# Patient Record
Sex: Male | Born: 1985 | Race: Black or African American | Hispanic: No | Marital: Single | Smoking: Never smoker
Health system: Southern US, Community
[De-identification: ages and names within clinical notes are randomized; demographics above are authoritative.]

## PROBLEM LIST (undated history)

## (undated) DIAGNOSIS — J939 Pneumothorax, unspecified: Secondary | ICD-10-CM

## (undated) DIAGNOSIS — Z789 Other specified health status: Secondary | ICD-10-CM

## (undated) HISTORY — PX: NO PAST SURGERIES: SHX2092

---
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
## 2006-04-30 ENCOUNTER — Emergency Department (HOSPITAL_COMMUNITY): Admission: EM | Admit: 2006-04-30 | Discharge: 2006-04-30 | Payer: Self-pay | Admitting: Emergency Medicine

## 2006-11-03 ENCOUNTER — Emergency Department (HOSPITAL_COMMUNITY): Admission: EM | Admit: 2006-11-03 | Discharge: 2006-11-03 | Payer: Self-pay | Admitting: Emergency Medicine

## 2007-09-12 ENCOUNTER — Emergency Department (HOSPITAL_COMMUNITY): Admission: EM | Admit: 2007-09-12 | Discharge: 2007-09-12 | Payer: Self-pay | Admitting: Emergency Medicine

## 2008-10-21 ENCOUNTER — Emergency Department (HOSPITAL_COMMUNITY): Admission: EM | Admit: 2008-10-21 | Discharge: 2008-10-21 | Payer: Self-pay | Admitting: Family Medicine

## 2010-03-12 ENCOUNTER — Emergency Department (HOSPITAL_COMMUNITY)
Admission: EM | Admit: 2010-03-12 | Discharge: 2010-03-12 | Payer: Self-pay | Source: Home / Self Care | Admitting: Family Medicine

## 2010-06-10 LAB — GLUCOSE, CAPILLARY: Glucose-Capillary: 87 mg/dL (ref 70–99)

## 2013-08-31 ENCOUNTER — Encounter (HOSPITAL_COMMUNITY): Payer: Self-pay | Admitting: Emergency Medicine

## 2013-08-31 ENCOUNTER — Emergency Department (HOSPITAL_COMMUNITY)
Admission: EM | Admit: 2013-08-31 | Discharge: 2013-08-31 | Disposition: A | Discharge status: Home / Self Care | Payer: Self-pay | Attending: Emergency Medicine | Admitting: Emergency Medicine

## 2013-08-31 DIAGNOSIS — K1370 Unspecified lesions of oral mucosa: Secondary | ICD-10-CM | POA: Insufficient documentation

## 2013-08-31 DIAGNOSIS — R509 Fever, unspecified: Secondary | ICD-10-CM | POA: Insufficient documentation

## 2013-08-31 DIAGNOSIS — K089 Disorder of teeth and supporting structures, unspecified: Secondary | ICD-10-CM | POA: Insufficient documentation

## 2013-08-31 DIAGNOSIS — Z880 Allergy status to penicillin: Secondary | ICD-10-CM | POA: Insufficient documentation

## 2013-08-31 DIAGNOSIS — K0889 Other specified disorders of teeth and supporting structures: Secondary | ICD-10-CM

## 2013-08-31 MED ORDER — OXYCODONE-ACETAMINOPHEN 5-325 MG PO TABS: 1.0000 | ORAL_TABLET | Freq: Four times a day (QID) | ORAL | Status: DC | PRN

## 2013-08-31 MED ORDER — OXYCODONE-ACETAMINOPHEN 5-325 MG PO TABS
2.0000 | ORAL_TABLET | Freq: Once | ORAL | Status: AC
  Administered 2013-08-31: 2 via ORAL
  Filled 2013-08-31: qty 2

## 2013-08-31 MED ORDER — ONDANSETRON 8 MG PO TBDP
8.0000 mg | ORAL_TABLET | Freq: Once | ORAL | Status: AC
  Administered 2013-08-31: 8 mg via ORAL
  Filled 2013-08-31: qty 1

## 2013-08-31 MED ORDER — CLINDAMYCIN HCL 300 MG PO CAPS: 300.0000 mg | ORAL_CAPSULE | Freq: Four times a day (QID) | ORAL | Status: DC

## 2013-08-31 NOTE — ED Notes (Signed)
Pt has a ride home.  

## 2013-08-31 NOTE — Discharge Instructions (Signed)
Dental Pain °A tooth ache may be caused by cavities (tooth decay). Cavities expose the nerve of the tooth to air and hot or cold temperatures. It may come from an infection or abscess (also called a boil or furuncle) around your tooth. It is also often caused by dental caries (tooth decay). This causes the pain you are having. °DIAGNOSIS  °Your caregiver can diagnose this problem by exam. °TREATMENT  °· If caused by an infection, it may be treated with medications which kill germs (antibiotics) and pain medications as prescribed by your caregiver. Take medications as directed. °· Only take over-the-counter or prescription medicines for pain, discomfort, or fever as directed by your caregiver. °· Whether the tooth ache today is caused by infection or dental disease, you should see your dentist as soon as possible for further care. °SEEK MEDICAL CARE IF: °The exam and treatment you received today has been provided on an emergency basis only. This is not a substitute for complete medical or dental care. If your problem worsens or new problems (symptoms) appear, and you are unable to meet with your dentist, call or return to this location. °SEEK IMMEDIATE MEDICAL CARE IF:  °· You have a fever. °· You develop redness and swelling of your face, jaw, or neck. °· You are unable to open your mouth. °· You have severe pain uncontrolled by pain medicine. °MAKE SURE YOU:  °· Understand these instructions. °· Will watch your condition. °· Will get help right away if you are not doing well or get worse. °Document Released: 03/16/2005 Document Revised: 06/08/2011 Document Reviewed: 11/02/2007 °ExitCare® Patient Information ©2014 [employer]. ° °Return to the emergency room for fever, change in vision, redness to the face that rapidly spreads towards the eye, nausea or vomiting, difficulty swallowing or shortness of breath. ° °You may follow with the dentist listed above. Alternatively,  you may also contact [facility] and Janna  [facility] who run a low-cost dental clinic at their phone number is [phone]. You may also call [phone] ° °Dental Assistance °If the dentist on-call cannot see you, please use the resources below: ° ° °Patients with Medicaid: [facility] °[address], [phone] °[address], [phone] ° °If unable to pay, or uninsured, contact [facility] ([phone]) or [facility] ([phone] in [place], [phone] in [place]) to become qualified for the adult dental clinic ° °Other Low-Cost Community Dental Services: °[facility]- [address], [place], [place], [address] °   [phone], Ext. 123 °   2nd and 4th Thursday of the month at 6:30am °   10 clients each day by appointment, can sometimes see walk-in     patients if someone does not show for an appointment °[facility]- [address], [place], [place], [address] °   [phone] °[facility]- [address], [place], [place], [address] °   [phone] ° °[facility]- [phone] °[facility]- [phone] °[facility]- [phone] ° °

## 2013-08-31 NOTE — ED Provider Notes (Signed)
CSN: [id number]     Arrival date & time 08/31/13  1706 History  This chart was scribed for Junious Silk, PA, working with Suzi Roots, MD, by Bronson Curb, ED Scribe. This patient was seen in room WTR8/WTR8 and the patient's care was started at 5:42 PM.     Chief Complaint  Patient presents with  . knots in mouth   . Dental Problem     The history is provided by the patient. No language interpreter was used.    HPI Comments: Alan Woods is a 28 y.o. male who presents to the Emergency Department complaining of upper right dental pain that began 4 days ago. Patient states his tooth "broke off" 6 months ago, but has not caused him any pain until recently. Patient also reports he caught a cold 5 weeks ago that has yet to resolve, and suspects this could be the cause of his dental pain. There is associated odor, right sided facial pain, right ear pain, and subjective fever (triage temp 98.7 F). He denies chills, nausea, emesis, or SOB. Patient states the pain is improved with pressure. Patient is allergic to PCN. Patient does not have insurance and is not established with a dentist.   History reviewed. No pertinent past medical history. History reviewed. No pertinent past surgical history. No family history on file. History  Substance Use Topics  . Smoking status: Never Smoker   . Smokeless tobacco: Never Used  . Alcohol Use: No    Review of Systems  Constitutional: Positive for fever (subjective). Negative for chills.  HENT: Positive for dental problem and ear pain.   Gastrointestinal: Negative for nausea and vomiting.  All other systems reviewed and are negative.     Allergies  Penicillins  Home Medications   Prior to Admission medications   Not on File   Triage Vitals: BP 160/97  Pulse 74  Temp(Src) 98.7 F (37.1 C) (Oral)  Resp 19  Ht 5\' 10"  (1.778 m)  Wt 274 lb (124.286 kg)  BMI 39.32 kg/m2  SpO2 100%  Physical Exam  Nursing note and vitals  reviewed. Constitutional: He is oriented to person, place, and time. He appears well-developed and well-nourished. No distress.  HENT:  Head: Normocephalic and atraumatic.  Right Ear: External ear normal.  Left Ear: External ear normal.  Nose: Nose normal.  Mouth/Throat: No trismus in the jaw. No dental abscesses.  Poor dentition. No drainable abscess. No trismus, submental edema, or tongue elevation.  Eyes: Conjunctivae are normal.  Neck: Normal range of motion. No tracheal deviation present.  Cardiovascular: Normal rate, regular rhythm and normal heart sounds.   Pulmonary/Chest: Effort normal and breath sounds normal. No stridor.  Abdominal: Soft. He exhibits no distension. There is no tenderness.  Musculoskeletal: Normal range of motion.  Neurological: He is alert and oriented to person, place, and time.  Skin: Skin is warm and dry. He is not diaphoretic.  Psychiatric: He has a normal mood and affect. His behavior is normal.    ED Course  Procedures (including critical care time)  DIAGNOSTIC STUDIES: Oxygen Saturation is 100% on room air, normal by my interpretation.    COORDINATION OF CARE: At 1745 Discussed treatment plan with patient which includes clindamycin. Patient agrees.   Labs Review Labs Reviewed - No data to display  Imaging Review No results found.   EKG Interpretation None      MDM   Final diagnoses:  Pain, dental    Patient with toothache.  No gross  abscess.  Exam unconcerning for Ludwig's angina or spread of infection.  Will treat with clindamycin and pain medicine.  Urged patient to follow-up with dentist.    I personally performed the services described in this documentation, which was scribed in my presence. The recorded information has been reviewed and is accurate.     Mora BellmanHannah S Tyjon Bowen, PA-C 08/31/13 [phone]

## 2013-08-31 NOTE — ED Notes (Signed)
Pt c/o dental pain and knots in his mouth and can smell infection for about 4 days now.  Pt states that he has cold like symptoms and been taking mucinex.

## 2013-09-02 NOTE — ED Provider Notes (Signed)
Medical screening examination/treatment/procedure(s) were performed by non-physician practitioner and as supervising physician I was immediately available for consultation/collaboration.    Suzi Roots, MD 09/02/13 [phone]

## 2015-01-16 ENCOUNTER — Emergency Department (HOSPITAL_COMMUNITY): Payer: Self-pay

## 2015-01-16 ENCOUNTER — Encounter (HOSPITAL_COMMUNITY): Payer: Self-pay | Admitting: Emergency Medicine

## 2015-01-16 ENCOUNTER — Emergency Department (HOSPITAL_COMMUNITY)
Admission: EM | Admit: 2015-01-16 | Discharge: 2015-01-16 | Disposition: A | Discharge status: Home / Self Care | Payer: Self-pay | Attending: Emergency Medicine | Admitting: Emergency Medicine

## 2015-01-16 DIAGNOSIS — S9031XA Contusion of right foot, initial encounter: Secondary | ICD-10-CM | POA: Insufficient documentation

## 2015-01-16 DIAGNOSIS — Y9289 Other specified places as the place of occurrence of the external cause: Secondary | ICD-10-CM | POA: Insufficient documentation

## 2015-01-16 DIAGNOSIS — W2209XA Striking against other stationary object, initial encounter: Secondary | ICD-10-CM | POA: Insufficient documentation

## 2015-01-16 DIAGNOSIS — Y9302 Activity, running: Secondary | ICD-10-CM | POA: Insufficient documentation

## 2015-01-16 DIAGNOSIS — Z880 Allergy status to penicillin: Secondary | ICD-10-CM | POA: Insufficient documentation

## 2015-01-16 DIAGNOSIS — Y998 Other external cause status: Secondary | ICD-10-CM | POA: Insufficient documentation

## 2015-01-16 DIAGNOSIS — T148XXA Other injury of unspecified body region, initial encounter: Secondary | ICD-10-CM

## 2015-01-16 MED ORDER — OXYCODONE-ACETAMINOPHEN 5-325 MG PO TABS
1.0000 | ORAL_TABLET | Freq: Once | ORAL | Status: AC
  Administered 2015-01-16: 1 via ORAL
  Filled 2015-01-16: qty 1

## 2015-01-16 NOTE — ED Notes (Signed)
Pt. presents with right foot pain/mild swelling , accidentally hit it against a log while running , skin intact .

## 2015-01-16 NOTE — ED Provider Notes (Signed)
CSN: [id number]     Arrival date & time 01/16/15  1958 History  By signing my name below, I, Alan Woods, attest that this documentation has been prepared under the direction and in the presence of Mohawk IndustriesJeff Marylynne Keelin, PA-C. Electronically Signed: Tanda RockersMargaux Woods, ED Scribe. 01/16/2015. 9:25 PM.  Chief Complaint  Patient presents with  . Foot Injury   The history is provided by the patient. No language interpreter was used.     HPI Comments: Alan Woods is a 29 y.o. male who presents to the Emergency Department complaining of right foot pain. Patient reports that approximately 3 hours prior to arrival he was running and jumped over a log hitting the dorsum of his right foot on the log causing immediate pain. Patient reports he is unable to ambulate after the incident. He reports immediate swelling, sharp pain. No history of the same, has not tried any medications prior to arrival.   History reviewed. No pertinent past medical history. History reviewed. No pertinent past surgical history. No family history on file. Social History  Substance Use Topics  . Smoking status: Never Smoker   . Smokeless tobacco: Never Used  . Alcohol Use: No    Review of Systems  A complete 10 system review of systems was obtained and all systems are negative except as noted in the HPI and PMH.    Allergies  Penicillins  Home Medications   Prior to Admission medications   Medication Sig Start Date End Date Taking? Authorizing Provider  clindamycin (CLEOCIN) 300 MG capsule Take 1 capsule (300 mg total) by mouth 4 (four) times daily. X 7 days 08/31/13   Junious SilkHannah Merrell, PA-C  oxyCODONE-acetaminophen (PERCOCET/ROXICET) 5-325 MG per tablet Take 1-2 tablets by mouth every 6 (six) hours as needed for moderate pain or severe pain. 08/31/13   Junious SilkHannah Merrell, PA-C   Triage Vitals: BP 155/78 mmHg  Pulse 96  Temp(Src) 98.3 F (36.8 C) (Oral)  Resp 16  Ht 5\' 10"  (1.778 m)  Wt 255 lb (115.667 kg)  BMI 36.59 kg/m2   SpO2 98%   Physical Exam  Constitutional: He is oriented to person, place, and time. He appears well-developed and well-nourished. No distress.  HENT:  Head: Normocephalic and atraumatic.  Eyes: Conjunctivae and EOM are normal.  Neck: Neck supple. No tracheal deviation present.  Cardiovascular: Normal rate.   Pulmonary/Chest: Effort normal. No respiratory distress.  Musculoskeletal: Normal range of motion.  Swelling to the right ankle and dorsum of foot, no lacerations or soft tissue injuries, and sensation intact, cap refill less than 3 seconds, pedal pulses 2+. Unable to range ankle due to patient's pain.  Neurological: He is alert and oriented to person, place, and time.  Skin: Skin is warm and dry.  Psychiatric: He has a normal mood and affect. His behavior is normal.  Nursing note and vitals reviewed.   ED Course  Procedures (including critical care time)  DIAGNOSTIC STUDIES: Oxygen Saturation is 98% on RA, normal by my interpretation.    COORDINATION OF CARE: 9:25 PM-Discussed treatment plan   Labs Review Labs Reviewed - No data to display  Imaging Review Dg Ankle Complete Right  01/16/2015  CLINICAL DATA:  RIGHT foot and ankle injury today, pain and swelling RIGHT foot at the metatarsals and at the lateral RIGHT ankle, initial encounter EXAM: RIGHT ANKLE - COMPLETE 3+ VIEW COMPARISON:  None FINDINGS: Osseous mineralization normal. Joint spaces preserved. No acute fracture, dislocation or bone destruction. IMPRESSION: No acute osseous abnormalities. Electronically Signed  By: Ulyses Southward M.D.   On: 01/16/2015 20:53   Dg Foot Complete Right  01/16/2015  CLINICAL DATA:  RIGHT foot and ankle injury today, pain and swelling RIGHT foot at metatarsals and lateral ankle, initial encounter EXAM: RIGHT FOOT COMPLETE - 3+ VIEW COMPARISON:  None FINDINGS: Osseous mineralization normal. Joint spaces preserved. No fracture, dislocation, or bone destruction. Mild dorsal soft tissue  swelling overlying the distal metatarsals. IMPRESSION: No acute osseous abnormalities. Electronically Signed   By: Ulyses Southward M.D.   On: 01/16/2015 20:51   I have personally reviewed and evaluated these images and lab results as part of my medical decision-making.   EKG Interpretation None      MDM   Final diagnoses:  Contusion   Labs:  Imaging: DG foot DG ankle- no acute findings  Consults:  Therapeutics: Percocet  Discharge Meds: Ibuprofen/Tylenol  Assessment/Plan: Patient presents with contusion to his right foot. No signs of fracture on plain films. Patient given ice, Percocet here in the ED with symptomatic improvement. Encouraged to use ibuprofen, Tylenol, ice and home. Patient instructed follow-up with primary care in 1 week if symptoms persist.       I personally performed the services described in this documentation, which was scribed in my presence. The recorded information has been reviewed and is accurate.      Eyvonne Mechanic, PA-C 01/16/15 2125  Bethann Berkshire, MD 01/16/15 2252

## 2015-01-16 NOTE — ED Notes (Signed)
Pt to x-ray at this timr

## 2015-01-16 NOTE — Discharge Instructions (Signed)
Please use ice, ibuprofen, Tylenol as needed for pain. Please follow-up with her primary care in 1 week if symptoms continue to persist.

## 2018-10-17 ENCOUNTER — Observation Stay (HOSPITAL_COMMUNITY)
Admission: EM | Admit: 2018-10-17 | Discharge: 2018-10-19 | Disposition: A | Discharge status: Home / Self Care | Payer: No Typology Code available for payment source | Attending: Emergency Medicine | Admitting: Emergency Medicine

## 2018-10-17 ENCOUNTER — Other Ambulatory Visit: Payer: Self-pay

## 2018-10-17 ENCOUNTER — Encounter (HOSPITAL_COMMUNITY): Payer: Self-pay

## 2018-10-17 DIAGNOSIS — S2243XA Multiple fractures of ribs, bilateral, initial encounter for closed fracture: Secondary | ICD-10-CM | POA: Insufficient documentation

## 2018-10-17 DIAGNOSIS — J939 Pneumothorax, unspecified: Secondary | ICD-10-CM | POA: Diagnosis present

## 2018-10-17 DIAGNOSIS — J9383 Other pneumothorax: Secondary | ICD-10-CM

## 2018-10-17 DIAGNOSIS — Y92410 Unspecified street and highway as the place of occurrence of the external cause: Secondary | ICD-10-CM | POA: Diagnosis not present

## 2018-10-17 DIAGNOSIS — F1290 Cannabis use, unspecified, uncomplicated: Secondary | ICD-10-CM | POA: Diagnosis not present

## 2018-10-17 DIAGNOSIS — Z1159 Encounter for screening for other viral diseases: Secondary | ICD-10-CM | POA: Diagnosis not present

## 2018-10-17 DIAGNOSIS — S270XXA Traumatic pneumothorax, initial encounter: Principal | ICD-10-CM | POA: Insufficient documentation

## 2018-10-17 DIAGNOSIS — S2249XA Multiple fractures of ribs, unspecified side, initial encounter for closed fracture: Secondary | ICD-10-CM

## 2018-10-17 HISTORY — DX: Pneumothorax, unspecified: J93.9

## 2018-10-17 HISTORY — DX: Other specified health status: Z78.9
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
## 2018-10-18 ENCOUNTER — Encounter (HOSPITAL_COMMUNITY): Payer: Self-pay | Admitting: General Practice

## 2018-10-18 ENCOUNTER — Emergency Department (HOSPITAL_COMMUNITY): Payer: No Typology Code available for payment source

## 2018-10-18 DIAGNOSIS — J939 Pneumothorax, unspecified: Secondary | ICD-10-CM | POA: Diagnosis present

## 2018-10-18 LAB — POCT I-STAT EG7
Bicarbonate: 24.5 mmol/L (ref 20.0–28.0)
Calcium, Ion: 1.03 mmol/L — ABNORMAL LOW (ref 1.15–1.40)
HCT: 39 % (ref 39.0–52.0)
Hemoglobin: 13.3 g/dL (ref 13.0–17.0)
O2 Saturation: 99 %
Potassium: 4 mmol/L (ref 3.5–5.1)
Sodium: 140 mmol/L (ref 135–145)
TCO2: 26 mmol/L (ref 22–32)
pCO2, Ven: 39.2 mmHg — ABNORMAL LOW (ref 44.0–60.0)
pH, Ven: 7.404 (ref 7.250–7.430)
pO2, Ven: 123 mmHg — ABNORMAL HIGH (ref 32.0–45.0)

## 2018-10-18 LAB — CBC WITH DIFFERENTIAL/PLATELET
Abs Immature Granulocytes: 0.07 10*3/uL (ref 0.00–0.07)
Basophils Absolute: 0 10*3/uL (ref 0.0–0.1)
Basophils Relative: 0 %
Eosinophils Absolute: 0.2 10*3/uL (ref 0.0–0.5)
Eosinophils Relative: 2 %
HCT: 40.2 % (ref 39.0–52.0)
Hemoglobin: 12.6 g/dL — ABNORMAL LOW (ref 13.0–17.0)
Immature Granulocytes: 1 %
Lymphocytes Relative: 30 %
Lymphs Abs: 3.3 10*3/uL (ref 0.7–4.0)
MCH: 26.3 pg (ref 26.0–34.0)
MCHC: 31.3 g/dL (ref 30.0–36.0)
MCV: 83.9 fL (ref 80.0–100.0)
Monocytes Absolute: 0.9 10*3/uL (ref 0.1–1.0)
Monocytes Relative: 8 %
Neutro Abs: 6.5 10*3/uL (ref 1.7–7.7)
Neutrophils Relative %: 59 %
Platelets: 272 10*3/uL (ref 150–400)
RBC: 4.79 MIL/uL (ref 4.22–5.81)
RDW: 14.2 % (ref 11.5–15.5)
WBC: 11 10*3/uL — ABNORMAL HIGH (ref 4.0–10.5)
nRBC: 0 % (ref 0.0–0.2)

## 2018-10-18 LAB — SARS CORONAVIRUS 2 BY RT PCR (HOSPITAL ORDER, PERFORMED IN ~~LOC~~ HOSPITAL LAB): SARS Coronavirus 2: NEGATIVE

## 2018-10-18 LAB — I-STAT CREATININE, ED: Creatinine, Ser: 1.1 mg/dL (ref 0.61–1.24)

## 2018-10-18 LAB — HIV ANTIBODY (ROUTINE TESTING W REFLEX): HIV Screen 4th Generation wRfx: NONREACTIVE

## 2018-10-18 MED ORDER — IOHEXOL 300 MG/ML  SOLN
125.0000 mL | Freq: Once | INTRAMUSCULAR | Status: AC | PRN
  Administered 2018-10-18: 125 mL via INTRAVENOUS

## 2018-10-18 MED ORDER — HYDROMORPHONE HCL 1 MG/ML IJ SOLN
1.0000 mg | Freq: Once | INTRAMUSCULAR | Status: AC
  Administered 2018-10-18: 02:00:00 1 mg via INTRAVENOUS
  Filled 2018-10-18: qty 1

## 2018-10-18 MED ORDER — IBUPROFEN 800 MG PO TABS
800.0000 mg | ORAL_TABLET | Freq: Three times a day (TID) | ORAL | Status: DC
  Administered 2018-10-18 – 2018-10-19 (×4): 800 mg via ORAL
  Filled 2018-10-18 (×4): qty 1

## 2018-10-18 MED ORDER — OXYCODONE HCL 5 MG PO TABS
5.0000 mg | ORAL_TABLET | ORAL | Status: DC | PRN
  Administered 2018-10-18: 5 mg via ORAL
  Filled 2018-10-18: qty 1

## 2018-10-18 MED ORDER — ENOXAPARIN SODIUM 40 MG/0.4ML ~~LOC~~ SOLN
40.0000 mg | Freq: Every day | SUBCUTANEOUS | Status: DC
  Administered 2018-10-18 – 2018-10-19 (×2): 40 mg via SUBCUTANEOUS
  Filled 2018-10-18 (×2): qty 0.4

## 2018-10-18 MED ORDER — DOCUSATE SODIUM 100 MG PO CAPS
100.0000 mg | ORAL_CAPSULE | Freq: Two times a day (BID) | ORAL | Status: DC
  Administered 2018-10-18 – 2018-10-19 (×2): 100 mg via ORAL
  Filled 2018-10-18 (×3): qty 1

## 2018-10-18 MED ORDER — HYDROMORPHONE HCL 1 MG/ML IJ SOLN: 0.5000 mg | INTRAMUSCULAR | Status: DC | PRN

## 2018-10-18 MED ORDER — FENTANYL CITRATE (PF) 100 MCG/2ML IJ SOLN
50.0000 ug | Freq: Once | INTRAMUSCULAR | Status: AC
  Administered 2018-10-18: 50 ug via INTRAVENOUS
  Filled 2018-10-18: qty 2

## 2018-10-18 MED ORDER — METOPROLOL TARTRATE 5 MG/5ML IV SOLN
5.0000 mg | Freq: Four times a day (QID) | INTRAVENOUS | Status: DC | PRN
  Administered 2018-10-18: 07:00:00 5 mg via INTRAVENOUS
  Filled 2018-10-18: qty 5

## 2018-10-18 MED ORDER — ACETAMINOPHEN 325 MG PO TABS
650.0000 mg | ORAL_TABLET | Freq: Four times a day (QID) | ORAL | Status: DC
  Administered 2018-10-18 – 2018-10-19 (×4): 650 mg via ORAL
  Filled 2018-10-18 (×4): qty 2

## 2018-10-18 MED ORDER — ONDANSETRON HCL 4 MG/2ML IJ SOLN: 4.0000 mg | Freq: Four times a day (QID) | INTRAMUSCULAR | Status: DC | PRN

## 2018-10-18 MED ORDER — METHOCARBAMOL 500 MG PO TABS
500.0000 mg | ORAL_TABLET | Freq: Four times a day (QID) | ORAL | Status: DC | PRN
  Administered 2018-10-18 – 2018-10-19 (×3): 500 mg via ORAL
  Filled 2018-10-18 (×3): qty 1

## 2018-10-18 MED ORDER — OXYCODONE HCL 5 MG PO TABS
5.0000 mg | ORAL_TABLET | ORAL | Status: DC | PRN
  Administered 2018-10-18 – 2018-10-19 (×2): 5 mg via ORAL
  Administered 2018-10-19: 09:00:00 10 mg via ORAL
  Filled 2018-10-18: qty 1
  Filled 2018-10-18: qty 2
  Filled 2018-10-18: qty 1

## 2018-10-18 MED ORDER — HYDROMORPHONE HCL 1 MG/ML IJ SOLN
1.0000 mg | Freq: Once | INTRAMUSCULAR | Status: AC
  Administered 2018-10-18: 06:00:00 1 mg via INTRAVENOUS
  Filled 2018-10-18: qty 1

## 2018-10-18 MED ORDER — BISACODYL 10 MG RE SUPP: 10.0000 mg | Freq: Every day | RECTAL | Status: DC | PRN

## 2018-10-18 MED ORDER — HYDRALAZINE HCL 20 MG/ML IJ SOLN: 10.0000 mg | INTRAMUSCULAR | Status: DC | PRN

## 2018-10-18 MED ORDER — GABAPENTIN 300 MG PO CAPS
300.0000 mg | ORAL_CAPSULE | Freq: Three times a day (TID) | ORAL | Status: DC
  Administered 2018-10-18 – 2018-10-19 (×4): 300 mg via ORAL
  Filled 2018-10-18 (×4): qty 1

## 2018-10-18 MED ORDER — ONDANSETRON 4 MG PO TBDP: 4.0000 mg | ORAL_TABLET | Freq: Four times a day (QID) | ORAL | Status: DC | PRN
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
## 2018-10-18 NOTE — ED Notes (Signed)
Alan Woods, [phone] for updates

## 2018-10-18 NOTE — ED Notes (Signed)
Breakfast ordered 
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
## 2018-10-18 NOTE — ED Notes (Signed)
I encouraged pt to use incentive spirometer 10 times an hour and pt verbalized understanding and demonstrated knowledge of how to use incentive spirometer.

## 2018-10-18 NOTE — ED Notes (Signed)
Ordered bfast 

## 2018-10-18 NOTE — ED Notes (Signed)
Report called to Sela Hua RN

## 2018-10-19 ENCOUNTER — Observation Stay (HOSPITAL_COMMUNITY): Payer: No Typology Code available for payment source

## 2018-10-19 LAB — CBC
HCT: 38.1 % — ABNORMAL LOW (ref 39.0–52.0)
Hemoglobin: 12.1 g/dL — ABNORMAL LOW (ref 13.0–17.0)
MCH: 26.3 pg (ref 26.0–34.0)
MCHC: 31.8 g/dL (ref 30.0–36.0)
MCV: 82.8 fL (ref 80.0–100.0)
Platelets: 224 10*3/uL (ref 150–400)
RBC: 4.6 MIL/uL (ref 4.22–5.81)
RDW: 14.2 % (ref 11.5–15.5)
WBC: 7.3 10*3/uL (ref 4.0–10.5)
nRBC: 0 % (ref 0.0–0.2)

## 2018-10-19 LAB — BASIC METABOLIC PANEL
Anion gap: 8 (ref 5–15)
BUN: 10 mg/dL (ref 6–20)
CO2: 26 mmol/L (ref 22–32)
Calcium: 8.6 mg/dL — ABNORMAL LOW (ref 8.9–10.3)
Chloride: 107 mmol/L (ref 98–111)
Creatinine, Ser: 1.21 mg/dL (ref 0.61–1.24)
GFR calc Af Amer: >60 mL/min (ref >60)
GFR calc non Af Amer: >60 mL/min (ref >60)
Glucose, Bld: 103 mg/dL — ABNORMAL HIGH (ref 70–99)
Potassium: 4.1 mmol/L (ref 3.5–5.1)
Sodium: 141 mmol/L (ref 135–145)

## 2018-10-19 MED ORDER — OXYCODONE HCL 5 MG PO TABS: 5.0000 mg | ORAL_TABLET | Freq: Four times a day (QID) | ORAL | 0 refills | Status: DC | PRN

## 2018-10-19 MED ORDER — ACETAMINOPHEN 325 MG PO TABS: 650.0000 mg | ORAL_TABLET | Freq: Four times a day (QID) | ORAL | Status: AC | PRN

## 2018-10-19 MED ORDER — IBUPROFEN 800 MG PO TABS: 800.0000 mg | ORAL_TABLET | Freq: Three times a day (TID) | ORAL | 0 refills | Status: AC

## 2018-10-19 MED ORDER — METHOCARBAMOL 750 MG PO TABS: 750.0000 mg | ORAL_TABLET | Freq: Four times a day (QID) | ORAL | 0 refills | Status: AC | PRN

## 2018-10-19 MED ORDER — GABAPENTIN 300 MG PO CAPS: 300.0000 mg | ORAL_CAPSULE | Freq: Three times a day (TID) | ORAL | 0 refills | Status: AC

## 2018-10-19 MED ORDER — METHOCARBAMOL 750 MG PO TABS: 750.0000 mg | ORAL_TABLET | Freq: Four times a day (QID) | ORAL | Status: DC | PRN

## 2018-10-19 MED FILL — IBUPROFEN 800 MG TAB: 800 | 20 days supply | Qty: 60 | Fill #0

## 2018-10-19 MED FILL — oxyCODONE HCL 5 MG TABS: 5 | 4 days supply | Qty: 30 | Fill #0

## 2018-10-19 MED FILL — GABAPENTIN 300 MG CAPSULE: 300 | 30 days supply | Qty: 90 | Fill #0

## 2018-10-19 MED FILL — METHOCARBAMOL 750 MG TABS: 750 | 15 days supply | Qty: 60 | Fill #0

## 2018-10-19 NOTE — Progress Notes (Signed)
Alan Woods to be D/C'd  per MD order. Discussed with the patient and all questions fully answered.  VSS, Skin clean, dry and intact without evidence of skin break down, no evidence of skin tears noted.  IV catheter discontinued intact. Site without signs and symptoms of complications. Dressing and pressure applied.  An After Visit Summary was printed and given to the patient. Patient received prescription.  D/c education completed with patient/family including follow up instructions, medication list, d/c activities limitations if indicated, with other d/c instructions as indicated by MD - patient able to verbalize understanding, all questions fully answered.   Patient instructed to return to ED, call 911, or call MD for any changes in condition.   Patient to be escorted via [place], and D/C home via private auto.

## 2018-10-19 NOTE — Discharge Instructions (Signed)
Pneumothorax °A pneumothorax is commonly called a collapsed lung. It is a condition in which air leaks from a lung and builds up between the thin layer of tissue that covers the lungs (visceral pleura) and the interior wall of the chest cavity (parietal pleura). The air gets trapped outside the lung, between the lung and the chest wall (pleural space). The air takes up space and prevents the lung from fully expanding. °This condition sometimes occurs suddenly with no apparent cause. The buildup of air may be small or large. A small pneumothorax may go away on its own. A large pneumothorax will require treatment and hospitalization. °What are the causes? °This condition may be caused by: °· Trauma and injury to the chest wall. °· Surgery and other medical procedures. °· A complication of an underlying lung problem, especially chronic obstructive pulmonary disease (COPD) or emphysema. °Sometimes the cause of this condition is not known. °What increases the risk? °You are more likely to develop this condition if: °· You have an underlying lung problem. °· You smoke. °· You are 20-40 years old, male, tall, and underweight. °· You have a personal or family history of pneumothorax. °· You have an eating disorder (anorexia nervosa). °This condition can also happen quickly, even in people with no history of lung problems. °What are the signs or symptoms? °Sometimes a pneumothorax will have no symptoms. When symptoms are present, they can include: °· Chest pain. °· Shortness of breath. °· Increased rate of breathing. °· Bluish color to your lips or skin (cyanosis). °How is this diagnosed? °This condition may be diagnosed by: °· A medical history and physical exam. °· A chest X-ray, chest CT scan, or ultrasound. °How is this treated? °Treatment depends on how severe your condition is. The goal of treatment is to remove the extra air and allow your lung to expand back to its normal size. °· For a small pneumothorax: °? No  treatment may be needed. °? Extra oxygen is sometimes used to make it go away more quickly. °· For a large pneumothorax or a pneumothorax that is causing symptoms, a procedure is done to drain the air from your lungs. To do this, a health care provider may use: °? A needle with a syringe. This is used to suck air from a pleural space where no additional leakage is taking place. °? A chest tube. This is used to suck air where there is ongoing leakage into the pleural space. The chest tube may need to remain in place for several days until the air leak has healed. °· In more severe cases, surgery may be needed to repair the damage that is causing the leak. °· If you have multiple pneumothorax episodes or have an air leak that will not heal, a procedure called a pleurodesis may be done. A medicine is placed in the pleural space to irritate the tissues around the lung so that the lung will stick to the chest wall, seal any leaks, and stop any buildup of air in that space. °If you have an underlying lung problem, severe symptoms, or a large pneumothorax you will usually need to stay in the hospital. °Follow these instructions at home: °Lifestyle °· Do not use any products that contain nicotine or tobacco, such as cigarettes and e-cigarettes. These are major risk factors in pneumothorax. If you need help quitting, ask your health care provider. °· Do not lift anything that is heavier than 10 lb (4.5 kg), or the limit that your health care   provider tells you, until he or she says that it is safe.  Avoid activities that take a lot of effort (strenuous) for as long as told by your health care provider.  Return to your normal activities as told by your health care provider. Ask your health care provider what activities are safe for you.  Do not fly in an airplane or scuba dive until your health care provider says it is okay. General instructions  Take over-the-counter and prescription medicines only as told by your  health care provider.  If a cough or pain makes it difficult for you to sleep at night, try sleeping in a semi-upright position in a recliner or by using 2 or 3 pillows.  If you had a chest tube and it was removed, ask your health care provider when you can remove the bandage (dressing). While the dressing is in place, do not allow it to get wet.  Keep all follow-up visits as told by your health care provider. This is important. Contact a health care provider if:  You cough up thick mucus (sputum) that is yellow or green in color.  You were treated with a chest tube, and you have redness, increasing pain, or discharge at the site where it was placed. Get help right away if:  You have increasing chest pain or shortness of breath.  You have a cough that will not go away.  You begin coughing up blood.  You have pain that is getting worse or is not controlled with medicines.  The site where your chest tube was located opens up.  You feel air coming out of the site where the chest tube was placed.  You have a fever or persistent symptoms for more than 2-3 days.  You have a fever and your symptoms suddenly get worse. These symptoms may represent a serious problem that is an emergency. Do not wait to see if the symptoms will go away. Get medical help right away. Call your local emergency services (911 in the [place] Do not drive yourself to the hospital. Summary  A pneumothorax, commonly called a collapsed lung, is a condition in which air leaks from a lung and gets trapped between the lung and the chest wall (pleural space).  The buildup of air may be small or large. A small pneumothorax may go away on its own. A large pneumothorax will require treatment and hospitalization.  Treatment for this condition depends on how severe the pneumothorax is. The goal of treatment is to remove the extra air and allow the lung to expand back to its normal size. This information is not intended to  replace advice given to you by your health care provider. Make sure you discuss any questions you have with your health care provider. Document Released: 03/16/2005 Document Revised: 02/26/2017 Document Reviewed: 02/22/2017 Elsevier Patient Education  2020 [employer].    Rib Fracture  A rib fracture is a break or crack in one of the bones of the ribs. The ribs are like a cage that goes around your upper chest. A broken or cracked rib is often painful, but most do not cause other problems. Most rib fractures usually heal on their own in 1-3 months. Follow these instructions at home: Managing pain, stiffness, and swelling  If directed, apply ice to the injured area. ? Put ice in a plastic bag. ? Place a towel between your skin and the bag. ? Leave the ice on for 20 minutes, 2-3 times a day.  Take over-the-counter and prescription medicines only as told by your doctor. Activity  Avoid activities that cause pain to the injured area. Protect your injured area.  Slowly increase activity as told by your doctor. General instructions  Do deep breathing as told by your doctor. You may be told to: ? Take deep breaths many times a day. ? Cough many times a day while hugging a pillow. ? Use a device (incentive spirometer) to do deep breathing many times a day.  Drink enough fluid to keep your pee (urine) clear or pale yellow.  Do not wear a rib belt or binder. These do not allow you to breathe deeply.  Keep all follow-up visits as told by your doctor. This is important. Contact a doctor if:  You have a fever. Get help right away if:  You have trouble breathing.  You are short of breath.  You cannot stop coughing.  You cough up thick or bloody spit (sputum).  You feel sick to your stomach (nauseous), throw up (vomit), or have belly (abdominal) pain.  Your pain gets worse and medicine does not help. Summary  A rib fracture is a break or crack in one of the bones of the  ribs.  Apply ice to the injured area and take medicines for pain as told by your doctor.  Take deep breaths and cough many times a day. Hug a pillow every time you cough. This information is not intended to replace advice given to you by your health care provider. Make sure you discuss any questions you have with your health care provider. Document Released: 12/24/2007 Document Revised: 02/26/2017 Document Reviewed: 06/16/2016 Elsevier Patient Education  2020 [employer]
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
## 2018-10-30 ENCOUNTER — Emergency Department (HOSPITAL_COMMUNITY): Payer: No Typology Code available for payment source

## 2018-10-30 ENCOUNTER — Emergency Department (HOSPITAL_COMMUNITY)
Admission: EM | Admit: 2018-10-30 | Discharge: 2018-10-30 | Disposition: A | Discharge status: Home / Self Care | Payer: No Typology Code available for payment source | Attending: Emergency Medicine | Admitting: Emergency Medicine

## 2018-10-30 ENCOUNTER — Encounter (HOSPITAL_COMMUNITY): Payer: Self-pay | Admitting: *Deleted

## 2018-10-30 ENCOUNTER — Other Ambulatory Visit: Payer: Self-pay

## 2018-10-30 DIAGNOSIS — S2242XD Multiple fractures of ribs, left side, subsequent encounter for fracture with routine healing: Secondary | ICD-10-CM | POA: Insufficient documentation

## 2018-10-30 DIAGNOSIS — R0789 Other chest pain: Secondary | ICD-10-CM | POA: Diagnosis present

## 2018-10-30 DIAGNOSIS — S2249XD Multiple fractures of ribs, unspecified side, subsequent encounter for fracture with routine healing: Secondary | ICD-10-CM

## 2018-10-30 MED ORDER — OXYCODONE HCL 5 MG PO TABS
5.0000 mg | ORAL_TABLET | Freq: Once | ORAL | Status: AC
  Administered 2018-10-30: 05:00:00 5 mg via ORAL
  Filled 2018-10-30: qty 1

## 2018-10-30 MED ORDER — OXYCODONE HCL 5 MG PO TABS: 5.0000 mg | ORAL_TABLET | Freq: Four times a day (QID) | ORAL | 0 refills | Status: AC | PRN

## 2018-10-30 NOTE — ED Notes (Signed)
Pt verbalized understanding of discharge paperwork, prescriptions and follow-up care 
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
## 2018-10-30 NOTE — ED Notes (Signed)
Patient placed on a monitor / pulse oximetry , IV site intact .
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
## 2018-10-30 NOTE — Discharge Instructions (Addendum)
Continue with your medications as prescribed.  Return if pain is not being adequately controlled at home.

## 2020-09-26 ENCOUNTER — Other Ambulatory Visit: Payer: Self-pay | Admitting: Internal Medicine

## 2020-09-27 LAB — LIPID PANEL
Cholesterol: 170 mg/dL (ref <200)
HDL: 31 mg/dL — ABNORMAL LOW (ref >=40)
LDL Cholesterol (Calc): 119 mg/dL (calc) — ABNORMAL HIGH
Non-HDL Cholesterol (Calc): 139 mg/dL (calc) — ABNORMAL HIGH (ref <130)
Total CHOL/HDL Ratio: 5.5 (calc) — ABNORMAL HIGH (ref <5.0)
Triglycerides: 96 mg/dL (ref <150)

## 2020-09-27 LAB — COMPLETE METABOLIC PANEL WITH GFR
AG Ratio: 1.5 (calc) (ref 1.0–2.5)
ALT: 14 U/L (ref 9–46)
AST: 17 U/L (ref 10–40)
Albumin: 4.1 g/dL (ref 3.6–5.1)
Alkaline phosphatase (APISO): 63 U/L (ref 36–130)
BUN: 12 mg/dL (ref 7–25)
CO2: 24 mmol/L (ref 20–32)
Calcium: 8.9 mg/dL (ref 8.6–10.3)
Chloride: 107 mmol/L (ref 98–110)
Creat: 1.09 mg/dL (ref 0.60–1.35)
GFR, Est African American: 100 mL/min/{1.73_m2} (ref >=60)
GFR, Est Non African American: 86 mL/min/{1.73_m2} (ref >=60)
Globulin: 2.8 g/dL (calc) (ref 1.9–3.7)
Glucose, Bld: 111 mg/dL — ABNORMAL HIGH (ref 65–99)
Potassium: 4.3 mmol/L (ref 3.5–5.3)
Sodium: 139 mmol/L (ref 135–146)
Total Bilirubin: 0.6 mg/dL (ref 0.2–1.2)
Total Protein: 6.9 g/dL (ref 6.1–8.1)

## 2020-09-27 LAB — CBC
HCT: 41.9 % (ref 38.5–50.0)
Hemoglobin: 13.6 g/dL (ref 13.2–17.1)
MCH: 25.9 pg — ABNORMAL LOW (ref 27.0–33.0)
MCHC: 32.5 g/dL (ref 32.0–36.0)
MCV: 79.8 fL — ABNORMAL LOW (ref 80.0–100.0)
MPV: 9.6 fL (ref 7.5–12.5)
Platelets: 256 10*3/uL (ref 140–400)
RBC: 5.25 10*6/uL (ref 4.20–5.80)
RDW: 13.5 % (ref 11.0–15.0)
WBC: 4 10*3/uL (ref 3.8–10.8)

## 2020-09-27 LAB — TSH: TSH: 0.52 mIU/L (ref 0.40–4.50)

## 2020-09-27 LAB — VITAMIN D 25 HYDROXY (VIT D DEFICIENCY, FRACTURES): Vit D, 25-Hydroxy: 23 ng/mL — ABNORMAL LOW (ref 30–100)

## 2021-04-16 IMAGING — DX PORTABLE CHEST - 1 VIEW
1 series · 1 of 1 positions shown · non-contrast
Comparison: Chest CT 10/18/2018

CLINICAL DATA: Followup bilateral pneumothoraces.

EXAM:
PORTABLE CHEST 1 VIEW

[chest ap]
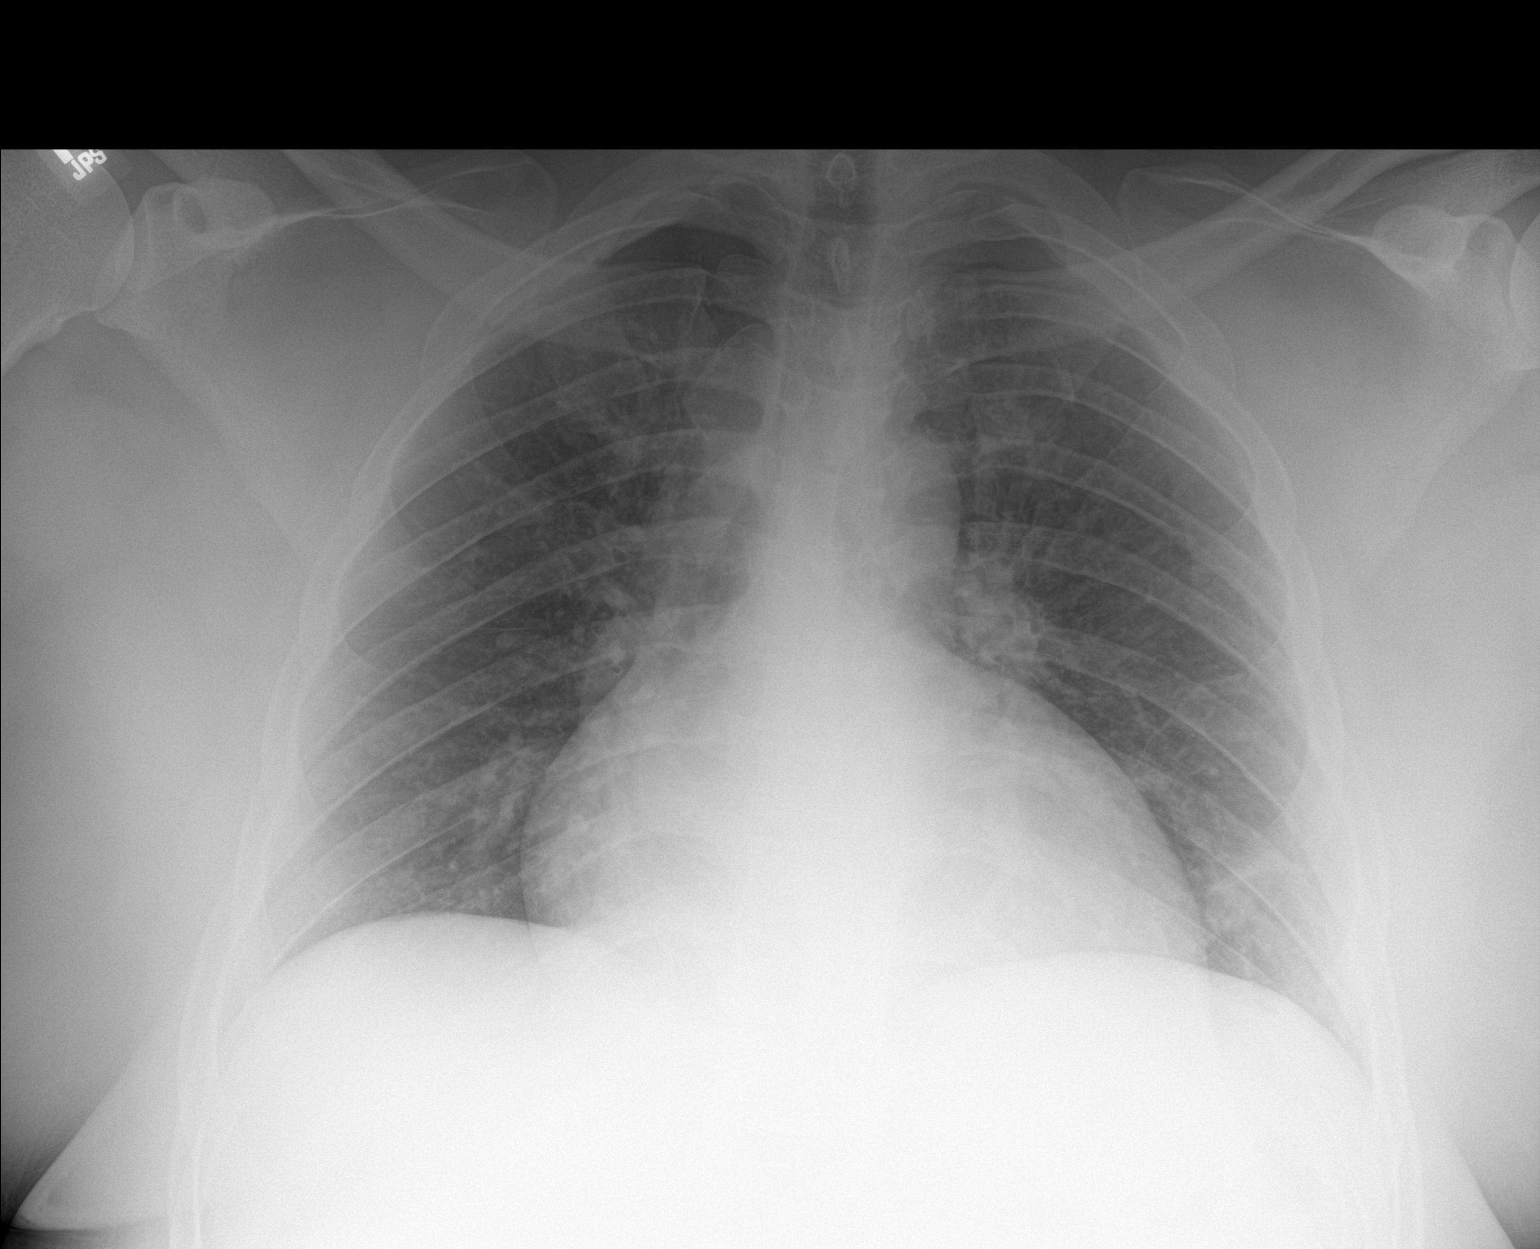

[1 of 1 positions shown; findings below may reference images not displayed]

FINDINGS: Borderline cardiac enlargement, stable. The mediastinal and hilar
contours are within normal limits.

The lungs are clear. No definite pneumothoraces. No pleural
effusions. Suspect small apical pleural hematomas. Minimal streaky
basilar atelectasis.
IMPRESSION: No definite pneumothoraces.

Stable borderline cardiac enlargement.

Streaky atelectasis but no infiltrates or effusions.

## 2021-04-20 ENCOUNTER — Other Ambulatory Visit: Payer: Self-pay

## 2021-04-20 ENCOUNTER — Emergency Department (HOSPITAL_COMMUNITY)
Admission: EM | Admit: 2021-04-20 | Discharge: 2021-04-20 | Disposition: A | Discharge status: Home / Self Care | Payer: No Typology Code available for payment source | Attending: Emergency Medicine | Admitting: Emergency Medicine

## 2021-04-20 ENCOUNTER — Encounter (HOSPITAL_COMMUNITY): Payer: Self-pay

## 2021-04-20 DIAGNOSIS — R519 Headache, unspecified: Secondary | ICD-10-CM | POA: Insufficient documentation

## 2021-04-20 DIAGNOSIS — Y92410 Unspecified street and highway as the place of occurrence of the external cause: Secondary | ICD-10-CM | POA: Diagnosis not present

## 2021-04-20 DIAGNOSIS — M791 Myalgia, unspecified site: Secondary | ICD-10-CM | POA: Diagnosis not present

## 2021-04-20 DIAGNOSIS — M549 Dorsalgia, unspecified: Secondary | ICD-10-CM | POA: Insufficient documentation

## 2021-04-20 MED ORDER — LIDOCAINE 5 % EX PTCH
1.0000 | MEDICATED_PATCH | CUTANEOUS | Status: DC
  Administered 2021-04-20: 1 via TRANSDERMAL
  Filled 2021-04-20: qty 1

## 2021-04-20 MED ORDER — LIDOCAINE 5 % EX PTCH: 1.0000 | MEDICATED_PATCH | CUTANEOUS | 0 refills | Status: AC
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
## 2021-04-20 NOTE — Discharge Instructions (Signed)
He can use Lidoderm patch for back pain if they helped.  Otherwise continue taking Tylenol and Motrin at home.  Take a few days off of work, return on Wednesday if feeling better.  If you start having inability to urinate, start vomiting, having loss of vision or vision changes return back to the ED for additional work-up.  Otherwise expect to have some soreness for up to 6 weeks.  Typically you should start to feel better in the next week or so.
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
## 2021-04-20 NOTE — ED Notes (Signed)
Dc instructions and scripts reviewed with pt no questions or concerns at this time. Will follow up with pcp.  

## 2022-04-23 ENCOUNTER — Other Ambulatory Visit: Payer: Self-pay | Admitting: Internal Medicine

## 2022-04-24 LAB — LIPID PANEL
Cholesterol: 153 mg/dL (ref <200)
HDL: 31 mg/dL — ABNORMAL LOW (ref >=40)
LDL Cholesterol (Calc): 100 mg/dL (calc) — ABNORMAL HIGH
Non-HDL Cholesterol (Calc): 122 mg/dL (calc) (ref <130)
Total CHOL/HDL Ratio: 4.9 (calc) (ref <5.0)
Triglycerides: 122 mg/dL (ref <150)

## 2022-04-24 LAB — CBC
HCT: 36.8 % — ABNORMAL LOW (ref 38.5–50.0)
Hemoglobin: 12.1 g/dL — ABNORMAL LOW (ref 13.2–17.1)
MCH: 26.4 pg — ABNORMAL LOW (ref 27.0–33.0)
MCHC: 32.9 g/dL (ref 32.0–36.0)
MCV: 80.2 fL (ref 80.0–100.0)
MPV: 10.3 fL (ref 7.5–12.5)
Platelets: 256 10*3/uL (ref 140–400)
RBC: 4.59 10*6/uL (ref 4.20–5.80)
RDW: 13.2 % (ref 11.0–15.0)
WBC: 7.6 10*3/uL (ref 3.8–10.8)

## 2022-04-24 LAB — COMPLETE METABOLIC PANEL WITH GFR
AG Ratio: 1.5 (calc) (ref 1.0–2.5)
ALT: 14 U/L (ref 9–46)
AST: 16 U/L (ref 10–40)
Albumin: 4.1 g/dL (ref 3.6–5.1)
Alkaline phosphatase (APISO): 94 U/L (ref 36–130)
BUN: 12 mg/dL (ref 7–25)
CO2: 27 mmol/L (ref 20–32)
Calcium: 8.9 mg/dL (ref 8.6–10.3)
Chloride: 105 mmol/L (ref 98–110)
Creat: 1.07 mg/dL (ref 0.60–1.26)
Globulin: 2.8 g/dL (calc) (ref 1.9–3.7)
Glucose, Bld: 112 mg/dL — ABNORMAL HIGH (ref 65–99)
Potassium: 3.8 mmol/L (ref 3.5–5.3)
Sodium: 140 mmol/L (ref 135–146)
Total Bilirubin: 0.4 mg/dL (ref 0.2–1.2)
Total Protein: 6.9 g/dL (ref 6.1–8.1)
eGFR: 91 mL/min/{1.73_m2} (ref >=60)
